# Patient Record
Sex: Male | Born: 1975 | Race: White | Hispanic: No | Marital: Married | Smoking: Former smoker
Health system: Southern US, Community
[De-identification: ages and names within clinical notes are randomized; demographics above are authoritative.]

## PROBLEM LIST (undated history)

## (undated) DIAGNOSIS — I493 Ventricular premature depolarization: Secondary | ICD-10-CM

---

## 2001-01-11 ENCOUNTER — Emergency Department (HOSPITAL_COMMUNITY): Admission: EM | Admit: 2001-01-11 | Discharge: 2001-01-11 | Payer: Self-pay | Admitting: Emergency Medicine

## 2001-02-25 ENCOUNTER — Emergency Department (HOSPITAL_COMMUNITY): Admission: EM | Admit: 2001-02-25 | Discharge: 2001-02-25 | Payer: Self-pay | Admitting: Emergency Medicine

## 2006-06-16 ENCOUNTER — Ambulatory Visit: Payer: Self-pay | Admitting: Family Medicine

## 2006-09-14 ENCOUNTER — Ambulatory Visit: Payer: Self-pay | Admitting: Family Medicine

## 2007-04-24 ENCOUNTER — Encounter (INDEPENDENT_AMBULATORY_CARE_PROVIDER_SITE_OTHER): Payer: Self-pay | Admitting: Internal Medicine

## 2007-08-16 ENCOUNTER — Encounter (INDEPENDENT_AMBULATORY_CARE_PROVIDER_SITE_OTHER): Payer: Self-pay | Admitting: Internal Medicine

## 2007-09-18 ENCOUNTER — Encounter (INDEPENDENT_AMBULATORY_CARE_PROVIDER_SITE_OTHER): Payer: Self-pay | Admitting: *Deleted

## 2007-09-21 ENCOUNTER — Encounter (INDEPENDENT_AMBULATORY_CARE_PROVIDER_SITE_OTHER): Payer: Self-pay | Admitting: Internal Medicine

## 2007-09-21 ENCOUNTER — Ambulatory Visit: Payer: Self-pay | Admitting: Family Medicine

## 2007-09-21 DIAGNOSIS — F17200 Nicotine dependence, unspecified, uncomplicated: Secondary | ICD-10-CM | POA: Insufficient documentation

## 2007-09-22 ENCOUNTER — Encounter (INDEPENDENT_AMBULATORY_CARE_PROVIDER_SITE_OTHER): Payer: Self-pay | Admitting: Internal Medicine

## 2008-06-21 HISTORY — PX: KNEE ARTHROSCOPY W/ MENISCAL REPAIR: SHX1877

## 2008-11-29 ENCOUNTER — Telehealth: Payer: Self-pay | Admitting: Family Medicine

## 2011-10-17 ENCOUNTER — Emergency Department (HOSPITAL_COMMUNITY): Payer: 59

## 2011-10-17 ENCOUNTER — Encounter (HOSPITAL_COMMUNITY): Payer: Self-pay | Admitting: Emergency Medicine

## 2011-10-17 ENCOUNTER — Emergency Department (HOSPITAL_COMMUNITY)
Admission: EM | Admit: 2011-10-17 | Discharge: 2011-10-17 | Disposition: A | Discharge status: Home / Self Care | Payer: 59 | Attending: Emergency Medicine | Admitting: Emergency Medicine

## 2011-10-17 DIAGNOSIS — S02401A Maxillary fracture, unspecified, initial encounter for closed fracture: Secondary | ICD-10-CM | POA: Insufficient documentation

## 2011-10-17 DIAGNOSIS — S7010XA Contusion of unspecified thigh, initial encounter: Secondary | ICD-10-CM | POA: Insufficient documentation

## 2011-10-17 DIAGNOSIS — IMO0002 Reserved for concepts with insufficient information to code with codable children: Secondary | ICD-10-CM | POA: Insufficient documentation

## 2011-10-17 DIAGNOSIS — Z79899 Other long term (current) drug therapy: Secondary | ICD-10-CM | POA: Insufficient documentation

## 2011-10-17 DIAGNOSIS — S0285XA Fracture of orbit, unspecified, initial encounter for closed fracture: Secondary | ICD-10-CM

## 2011-10-17 DIAGNOSIS — S0280XA Fracture of other specified skull and facial bones, unspecified side, initial encounter for closed fracture: Secondary | ICD-10-CM | POA: Insufficient documentation

## 2011-10-17 DIAGNOSIS — R51 Headache: Secondary | ICD-10-CM | POA: Insufficient documentation

## 2011-10-17 DIAGNOSIS — M79609 Pain in unspecified limb: Secondary | ICD-10-CM | POA: Insufficient documentation

## 2011-10-17 DIAGNOSIS — I493 Ventricular premature depolarization: Secondary | ICD-10-CM | POA: Insufficient documentation

## 2011-10-17 DIAGNOSIS — S01409A Unspecified open wound of unspecified cheek and temporomandibular area, initial encounter: Secondary | ICD-10-CM | POA: Insufficient documentation

## 2011-10-17 HISTORY — DX: Ventricular premature depolarization: I49.3

## 2011-10-17 MED ORDER — IBUPROFEN 800 MG PO TABS
800.0000 mg | ORAL_TABLET | Freq: Once | ORAL | Status: AC
  Administered 2011-10-17: 800 mg via ORAL
  Filled 2011-10-17: qty 1

## 2011-10-17 MED ORDER — AMOXICILLIN-POT CLAVULANATE 875-125 MG PO TABS
1.0000 | ORAL_TABLET | ORAL | Status: AC
  Administered 2011-10-17: 1 via ORAL
  Filled 2011-10-17: qty 1

## 2011-10-17 MED ORDER — ACETAMINOPHEN 325 MG PO TABS
975.0000 mg | ORAL_TABLET | Freq: Once | ORAL | Status: AC
  Administered 2011-10-17: 975 mg via ORAL
  Filled 2011-10-17: qty 3

## 2011-10-17 MED ORDER — TETANUS-DIPHTH-ACELL PERTUSSIS 5-2.5-18.5 LF-MCG/0.5 IM SUSP
0.5000 mL | Freq: Once | INTRAMUSCULAR | Status: AC
  Administered 2011-10-17: 0.5 mL via INTRAMUSCULAR
  Filled 2011-10-17: qty 0.5

## 2011-10-17 MED ORDER — AMOXICILLIN-POT CLAVULANATE 875-125 MG PO TABS: 1.0000 | ORAL_TABLET | Freq: Two times a day (BID) | ORAL | Status: AC

## 2011-10-17 MED ORDER — OXYCODONE-ACETAMINOPHEN 5-325 MG PO TABS: 1.0000 | ORAL_TABLET | Freq: Four times a day (QID) | ORAL | Status: AC | PRN
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
## 2011-10-17 NOTE — ED Notes (Signed)
Provided coffee to family member.
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
## 2015-03-03 ENCOUNTER — Ambulatory Visit
Admission: RE | Admit: 2015-03-03 | Discharge: 2015-03-03 | Disposition: A | Discharge status: Home / Self Care | Payer: No Typology Code available for payment source | Source: Ambulatory Visit | Attending: *Deleted | Admitting: *Deleted

## 2015-03-03 ENCOUNTER — Other Ambulatory Visit: Payer: Self-pay | Admitting: *Deleted

## 2015-03-03 DIAGNOSIS — Z9289 Personal history of other medical treatment: Secondary | ICD-10-CM

## 2015-12-12 ENCOUNTER — Ambulatory Visit (INDEPENDENT_AMBULATORY_CARE_PROVIDER_SITE_OTHER): Payer: Commercial Managed Care - HMO

## 2015-12-12 ENCOUNTER — Encounter: Payer: Self-pay | Admitting: Podiatry

## 2015-12-12 ENCOUNTER — Ambulatory Visit (INDEPENDENT_AMBULATORY_CARE_PROVIDER_SITE_OTHER): Payer: Commercial Managed Care - HMO | Admitting: Podiatry

## 2015-12-12 VITALS — BP 140/83 | HR 71 | Resp 16

## 2015-12-12 DIAGNOSIS — M722 Plantar fascial fibromatosis: Secondary | ICD-10-CM

## 2015-12-12 DIAGNOSIS — B351 Tinea unguium: Secondary | ICD-10-CM | POA: Diagnosis not present

## 2015-12-12 MED ORDER — TRIAMCINOLONE ACETONIDE 10 MG/ML IJ SUSP
10.0000 mg | Freq: Once | INTRAMUSCULAR | Status: AC
  Administered 2015-12-12: 10 mg

## 2015-12-12 MED ORDER — DICLOFENAC SODIUM 75 MG PO TBEC: 75.0000 mg | DELAYED_RELEASE_TABLET | Freq: Two times a day (BID) | ORAL | Status: DC

## 2015-12-12 NOTE — Progress Notes (Signed)
   Subjective:    Patient ID: Kevin Chaney, male    DOB: 11-14-1975, 40 y.o.   MRN: [id number]  HPI    Review of Systems  All other systems reviewed and are negative.      Objective:   Physical Exam        Assessment & Plan:

## 2015-12-12 NOTE — Patient Instructions (Signed)

## 2015-12-14 NOTE — Progress Notes (Signed)
Subjective:     Patient ID: Kevin Chaney, male   DOB: January 30, 1976, 40 y.o.   MRN: [id number]  HPI patient presents with quite a bit of discomfort in the heel right over left with inflammation and fluid buildup upon palpation. States that this is been going on for a while and has worsened recently   Review of Systems  All other systems reviewed and are negative.      Objective:   Physical Exam  Constitutional: He is oriented to person, place, and time.  Cardiovascular: Intact distal pulses.   Musculoskeletal: Normal range of motion.  Neurological: He is oriented to person, place, and time.  Skin: Skin is warm.  Nursing note and vitals reviewed.  neurovascular status intact muscle strength adequate range of motion within normal limits with patient noted to have exquisite discomfort in the heel right over left with inflammation and fluid around the medial band. Patient's found to have good digital perfusion and is well oriented 3 with no other pathology noted     Assessment:     Acute plantar fasciitis of the heel right over left    Plan:     H&P and x-rays reviewed with patient. Today I injected the fascial band 3 mg Kenalog 5 mg Xylocaine and applied fascial brace and gave instructions on physical therapy. I went ahead and will see back again in several weeks or earlier if needed  X-ray report indicated small spur with no indications of stress fracture or arthritis at this time

## 2015-12-26 ENCOUNTER — Ambulatory Visit (INDEPENDENT_AMBULATORY_CARE_PROVIDER_SITE_OTHER): Payer: Commercial Managed Care - HMO | Admitting: Podiatry

## 2015-12-26 DIAGNOSIS — M722 Plantar fascial fibromatosis: Secondary | ICD-10-CM

## 2015-12-28 NOTE — Progress Notes (Signed)
Subjective:     Patient ID: Kevin Chaney, male   DOB: 1975-09-05, 40 y.o.   MRN: [id number]  HPI patient presents stating I still get a little pain in my right foot but it has improved quite dramatically   Review of Systems     Objective:   Physical Exam Neurovascular status intact muscle strength adequate range of motion within normal limits with patient noted to have mild to moderate discomfort plantar aspect right heel upon deep palpation    Assessment:     Improving fasciitis right with pain still present    Plan:     Reviewed condition and at this time advised on physical therapy anti-inflammatories supportive shoes and if symptoms persist or get worse we will consider orthotics or other treatment

## 2016-07-08 ENCOUNTER — Ambulatory Visit: Payer: Self-pay | Admitting: Primary Care

## 2016-07-20 ENCOUNTER — Ambulatory Visit: Payer: Self-pay | Admitting: Primary Care

## 2016-07-27 ENCOUNTER — Ambulatory Visit: Payer: Self-pay | Admitting: Primary Care

## 2016-07-27 ENCOUNTER — Ambulatory Visit (INDEPENDENT_AMBULATORY_CARE_PROVIDER_SITE_OTHER): Payer: Commercial Managed Care - HMO | Admitting: Primary Care

## 2016-07-27 ENCOUNTER — Encounter: Payer: Self-pay | Admitting: Primary Care

## 2016-07-27 VITALS — BP 142/80 | HR 82 | Temp 98.0°F | Ht 75.0 in | Wt 252.0 lb

## 2016-07-27 DIAGNOSIS — B001 Herpesviral vesicular dermatitis: Secondary | ICD-10-CM | POA: Diagnosis not present

## 2016-07-27 DIAGNOSIS — F4310 Post-traumatic stress disorder, unspecified: Secondary | ICD-10-CM | POA: Diagnosis not present

## 2016-07-27 DIAGNOSIS — F17200 Nicotine dependence, unspecified, uncomplicated: Secondary | ICD-10-CM

## 2016-07-27 DIAGNOSIS — I493 Ventricular premature depolarization: Secondary | ICD-10-CM

## 2016-07-27 HISTORY — DX: Post-traumatic stress disorder, unspecified: F43.10

## 2016-07-27 MED ORDER — FAMCICLOVIR 500 MG PO TABS: ORAL_TABLET | ORAL | 0 refills | Status: DC

## 2016-07-27 NOTE — Assessment & Plan Note (Signed)
Noted today on exam. Denies chest pain.

## 2016-07-27 NOTE — Assessment & Plan Note (Signed)
No symptoms since treatment for alcoholism. Will trial wean off Zoloft over the next 2+ weeks. He will update if symptoms reoccur.

## 2016-07-27 NOTE — Assessment & Plan Note (Signed)
Long history of, typically occur annually. Discussed to notify if he continues to experience breakouts weekly, will need to consider prophylactic treatment. Rx for famciclovir sent to pharmacy.

## 2016-07-27 NOTE — Patient Instructions (Addendum)
Start weaning off of sertraline (Zoloft). Take 1/2 tablet daily for 7 days, then 1/2 tablet daily for 10 days, then stop.  I sent refills for famciclovir 500 mg tablets to your pharmacy. Take 3 tablets by mouth at once at onset of cold sore. Please notify me if you continue to get these weekly.  I will be in touch regarding Chantix for chewing tobacco. You could try the nicotine gum first and notify me if no improvement.   It was a pleasure to meet you today! Please don't hesitate to call me with any questions. Welcome to [facility]!

## 2016-07-27 NOTE — Assessment & Plan Note (Signed)
Interested in quitting, discussed options today. Will have him trial nicotine gum. Consider Chantix if no improvement.

## 2016-07-27 NOTE — Progress Notes (Signed)
Subjective:    Patient ID: Kevin Chaney, male    DOB: 03/23/1976, 41 y.o.   MRN: [id number]  HPI  Mr. Vite is a 41 year old male who presents today to establish care and discuss the problems mentioned below. Will obtain old records.  1) PTSD: Diagnosed during ETOH rehabilitation 17 months ago. Currently managed on Zoloft 50 mg. The medication makes him drowsy which is why he'd like to wean off. He was previously in counseling for which he completed 1 year ago. He denies symptoms of anxiety, daily worry, irritability. Denies SI/HI.   2) Elevated Blood Pressure: History of readings at 140/80's. His blood pressure has decreased since rehabilitation. Strong family history of high blood pressure in his father. 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 His BP in the office today is 142/80. He does not routinely check his BP.  3) Tobacco Abuse: Has used chewing tobacco for the past 20 years. He dips 2 cans daily and is interested in quitting. He's tried nicotine patches which didn't stay on his skin. He's interested in Chantix for cessation.  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 He will experience sores once annually but over the past 1 month he's been getting cold sores once weekly. He's been taking Famciclovir for his symptoms with improvement.   Review of Systems  Constitutional: Negative for unexpected weight change.  HENT:       No lesions to mouth  Eyes: Negative for visual disturbance.  Respiratory: Negative for shortness of breath.   Cardiovascular: Negative for chest pain.  Skin:       Cold sores  Neurological: Negative for dizziness and headaches.       Cold sores  Psychiatric/Behavioral: Negative for sleep disturbance. The patient is not nervous/anxious.        Past Medical History:  Diagnosis Date  . PVC's (premature ventricular contractions)      Social History   Social History  . Marital status: Married    Spouse name: N/A  .  Number of children: N/A  . Years of education: N/A   Occupational History  . Not on file.   Social History Main Topics  . Smoking status: Former Smoker    Types: Cigarettes  . Smokeless tobacco: Current User     Comment: Dip  . Alcohol use Yes  . Drug use: No  . Sexual activity: Not on file   Other Topics Concern  . Not on file   Social History Narrative   Married.   2 children.   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        Past Surgical History:  Procedure Laterality Date  . KNEE ARTHROSCOPY W/ MENISCAL REPAIR Right 2010    Family History  Problem Relation Age of Onset  . Hypertension Father   . Lung cancer Father   . Alcohol abuse Father   . Alcohol abuse Brother     Allergies  Allergen Reactions  . Codeine     REACTION: GI upset    Current Outpatient Prescriptions on File Prior to Visit  Medication Sig Dispense Refill  . ibuprofen (ADVIL,MOTRIN) 200 MG tablet Take 400 mg by mouth every 6 (six) hours as needed. Reported on 12/12/2015    . sertraline (ZOLOFT) 50 MG tablet Take 50 mg by mouth daily.  3   No current facility-administered medications on file prior to visit.     BP (!) 142/80   Pulse 82  Temp 98 F (36.7 C) (Oral)   Ht 6\' 3"  (1.905 m)   Wt 252 lb (114.3 kg)   SpO2 98%   BMI 31.50 kg/m    Objective:   Physical Exam  Constitutional: He is oriented to person, place, and time. He appears well-nourished.  HENT:  Mouth/Throat: Oropharynx is clear and moist.  Neck: Neck supple.  Cardiovascular: Normal rate and regular rhythm.   Pulmonary/Chest: Effort normal and breath sounds normal. He has no wheezes. He has no rales.  Neurological: He is alert and oriented to person, place, and time.  Skin: Skin is warm and dry.  Psychiatric: He has a normal mood and affect.          Assessment & Plan:

## 2016-07-27 NOTE — Progress Notes (Signed)
Pre visit review using our clinic review tool, if applicable. No additional management support is needed unless otherwise documented below in the visit note. 

## 2016-08-11 ENCOUNTER — Telehealth: Payer: Self-pay | Admitting: Primary Care

## 2016-08-11 NOTE — Telephone Encounter (Signed)
Message left for patient to return my call.  

## 2016-08-11 NOTE — Telephone Encounter (Addendum)
-----   Message from Pleas Koch, NP sent at 07/27/2016  5:37 PM EST ----- Regarding: Chewing Tobacco Please check on patient. How's he doing with the nicotine gum? (Consider Chantix for treatment)

## 2017-05-31 DIAGNOSIS — D225 Melanocytic nevi of trunk: Secondary | ICD-10-CM | POA: Diagnosis not present

## 2017-05-31 DIAGNOSIS — D2261 Melanocytic nevi of right upper limb, including shoulder: Secondary | ICD-10-CM | POA: Diagnosis not present

## 2017-05-31 DIAGNOSIS — D2262 Melanocytic nevi of left upper limb, including shoulder: Secondary | ICD-10-CM | POA: Diagnosis not present

## 2017-06-30 ENCOUNTER — Other Ambulatory Visit: Payer: Self-pay | Admitting: Primary Care

## 2017-06-30 DIAGNOSIS — B001 Herpesviral vesicular dermatitis: Secondary | ICD-10-CM

## 2017-07-01 NOTE — Telephone Encounter (Signed)
Ok to refill? Electronically refill request for famciclovir (FAMVIR) 500 MG tablet  Last prescribed on 07/27/2016. Last seen on 07/27/2016

## 2017-07-01 NOTE — Telephone Encounter (Signed)
Needs office visit for further refills. Please schedule.  

## 2017-07-04 NOTE — Telephone Encounter (Signed)
Left detailed message on voicemail for patient to call and schedule appointment for further refills per [facility]. (on DPR)

## 2017-07-06 NOTE — Telephone Encounter (Signed)
Letter sending to remind patient to schedule office visit.  Per DPR, left detail message of Kate's comments for patient to schedule appt.

## 2018-05-31 DIAGNOSIS — L570 Actinic keratosis: Secondary | ICD-10-CM | POA: Diagnosis not present

## 2018-05-31 DIAGNOSIS — D225 Melanocytic nevi of trunk: Secondary | ICD-10-CM | POA: Diagnosis not present

## 2018-05-31 DIAGNOSIS — B351 Tinea unguium: Secondary | ICD-10-CM | POA: Diagnosis not present

## 2019-03-06 ENCOUNTER — Other Ambulatory Visit: Payer: Self-pay

## 2019-03-06 ENCOUNTER — Ambulatory Visit
Admission: RE | Admit: 2019-03-06 | Discharge: 2019-03-06 | Disposition: A | Discharge status: Home / Self Care | Payer: No Typology Code available for payment source | Source: Ambulatory Visit | Attending: Nurse Practitioner | Admitting: Nurse Practitioner

## 2019-03-06 ENCOUNTER — Other Ambulatory Visit: Payer: Self-pay | Admitting: Nurse Practitioner

## 2019-03-06 DIAGNOSIS — T148XXA Other injury of unspecified body region, initial encounter: Secondary | ICD-10-CM

## 2021-05-01 ENCOUNTER — Ambulatory Visit (INDEPENDENT_AMBULATORY_CARE_PROVIDER_SITE_OTHER): Payer: 59

## 2021-05-01 ENCOUNTER — Other Ambulatory Visit: Payer: Self-pay

## 2021-05-01 ENCOUNTER — Ambulatory Visit: Payer: 59 | Admitting: Podiatry

## 2021-05-01 ENCOUNTER — Encounter: Payer: Self-pay | Admitting: Podiatry

## 2021-05-01 DIAGNOSIS — M722 Plantar fascial fibromatosis: Secondary | ICD-10-CM

## 2021-05-01 MED ORDER — TRIAMCINOLONE ACETONIDE 10 MG/ML IJ SUSP
10.0000 mg | Freq: Once | INTRAMUSCULAR | Status: AC
  Administered 2021-05-01: 10 mg

## 2021-05-01 MED ORDER — DICLOFENAC SODIUM 75 MG PO TBEC: 75.0000 mg | DELAYED_RELEASE_TABLET | Freq: Two times a day (BID) | ORAL | 2 refills | Status: DC

## 2021-05-01 NOTE — Patient Instructions (Signed)

## 2021-05-04 NOTE — Progress Notes (Signed)
Subjective:   Patient ID: Kevin Chaney, male   DOB: 45 y.o.   MRN: [id number]   HPI Patient's not been seen for a lot of years and over the last 6 months has developed progressive pain in the left heel at the insertional point of the tendon into the calcaneus.  Does not remember specific injury does not smoke likes to be active   Review of Systems  All other systems reviewed and are negative.      Objective:  Physical Exam Vitals and nursing note reviewed.  Constitutional:      Appearance: He is well-developed.  Pulmonary:     Effort: Pulmonary effort is normal.  Musculoskeletal:        General: Normal range of motion.  Skin:    General: Skin is warm.  Neurological:     Mental Status: He is alert.    Neurovascular status intact muscle strength found to be adequate range of motion adequate mild equinus condition noted with discomfort plantar heel left at the insertional point of the tendon into the calcaneus with fluid buildup around the insertional point     Assessment:  Acute plantar fasciitis left inflammation fluid around the band     Plan:  H&P reviewed condition sterile prep done and injected the plantar fascia at insertion 3 mg Kenalog 5 mg Xylocaine applied fascial brace to lift up the arch instructed on supportive shoes anti-inflammatories diclofenac 75 mg twice daily and physical therapy  X-rays indicate spur formation no indication stress fracture arthritis

## 2021-08-10 ENCOUNTER — Ambulatory Visit: Payer: 59 | Admitting: Podiatry

## 2021-08-10 ENCOUNTER — Other Ambulatory Visit: Payer: Self-pay

## 2021-08-10 DIAGNOSIS — M722 Plantar fascial fibromatosis: Secondary | ICD-10-CM | POA: Diagnosis not present

## 2021-08-10 MED ORDER — TRIAMCINOLONE ACETONIDE 10 MG/ML IJ SUSP
10.0000 mg | Freq: Once | INTRAMUSCULAR | Status: AC
  Administered 2021-08-10: 10 mg

## 2021-08-10 MED ORDER — MELOXICAM 15 MG PO TABS: 15.0000 mg | ORAL_TABLET | Freq: Every day | ORAL | 2 refills | Status: DC

## 2021-08-10 NOTE — Progress Notes (Signed)
Subjective:   Patient ID: Kevin Chaney, male   DOB: 46 y.o.   MRN: [id number]   HPI Patient states his heel has been quite sore and making it hard for him to walk.  States that he has tried different modalities without relief of symptoms and has had gradual increase in symptoms over the last few weeks   ROS      Objective:  Physical Exam  Neurovascular status intact exquisite discomfort left plantar fascia at insertion which is gotten worse recently with patient having job that he is on his feet for an long periods of time     Assessment:  Acute plantar fasciitis left worse when he gets up the morning after periods of sitting     Plan:  H&P reviewed condition sterile prep injected the fascia 3 mg Kenalog 5 mg Xylocaine dispensed night splint with instructions on usage along with ice therapy.  Reappoint to recheck and may require orthotics if symptoms persist

## 2021-08-31 ENCOUNTER — Other Ambulatory Visit: Payer: Self-pay

## 2021-08-31 ENCOUNTER — Encounter: Payer: Self-pay | Admitting: Podiatry

## 2021-08-31 ENCOUNTER — Ambulatory Visit: Payer: 59

## 2021-08-31 ENCOUNTER — Ambulatory Visit: Payer: 59 | Admitting: Podiatry

## 2021-08-31 DIAGNOSIS — M722 Plantar fascial fibromatosis: Secondary | ICD-10-CM

## 2021-08-31 MED ORDER — TRIAMCINOLONE ACETONIDE 10 MG/ML IJ SUSP
10.0000 mg | Freq: Once | INTRAMUSCULAR | Status: AC
  Administered 2021-08-31: 10 mg

## 2021-08-31 MED ORDER — PREDNISONE 10 MG PO TABS: ORAL_TABLET | ORAL | 0 refills | Status: DC

## 2021-08-31 NOTE — Progress Notes (Signed)
Subjective:  ? ?Patient ID: Kevin Chaney, male   DOB: 46 y.o.   MRN: [id number]  ? ?HPI ?Patient presents stating still having a lot of pain in his left heel especially when walking and he knows he needs orthotics and has had numerous bouts of heel pain in his history ? ? ?ROS ? ? ?   ?Objective:  ?Physical Exam  ?Neurovascular status intact with exquisite discomfort plantar fascial left still present medial and into the central band at the insertion calcaneus with flatfoot deformity ? ?   ?Assessment:  ?Acute Planter fasciitis left that is not responding so far to conservative aggressive treatment ? ?   ?Plan:  ?H&P educated him on this and at this point I am applying air fracture walker to completely immobilize the heel and take all stress off at hoping that we can get him out of this acute crisis.  I did go ahead today also casted him for functional orthotics to lift up his arch and I did do sterile prep and I reinjected the fascia 3 mg Kenalog 5 mg Xylocaine and also put him on a steroid 12-day Dosepak.  Patient will be seen back in approximately 4 to 5 weeks orthotics and will see me depending on response with education for wearing the boot full-time for 3 to 4 weeks and then gradually reduce ?   ? ? ?

## 2021-08-31 NOTE — Progress Notes (Signed)
SITUATION ?Reason for Consult: Evaluation for Bilateral Custom Foot Orthoses ?Patient / Caregiver Report: Patient is ready for foot orthotics ? ?OBJECTIVE DATA: ?Patient History / Diagnosis:  ?  ICD-10-CM   ?1. Plantar fasciitis of left foot  M72.2   ?  ? ? ?Current or Previous Devices: None and no history ? ?Foot Examination: ?Skin presentation:   Intact ?Ulcers & Callousing:   None and no history ?Toe / Foot Deformities:  Pes cavus ?Weight Bearing Presentation:  Cavus ?Sensation:    Intact ? ?Shoe Size: 34M ? ?ORTHOTIC RECOMMENDATION ?Recommended Device: 1x pair of custom functional foot orthotics ? ?GOALS OF ORTHOSES ?- Reduce Pain ?- Prevent Foot Deformity ?- Prevent Progression of Further Foot Deformity ?- Relieve Pressure ?- Improve the Overall Biomechanical Function of the Foot and Lower Extremity. ? ?ACTIONS PERFORMED ?Patient was casted for Foot Orthoses via crush box. Procedure was explained and patient tolerated procedure well. All questions were answered and concerns addressed. ? ?PLAN ?Potential out of pocket cost was communicated to patient. Casts are to be sent to [facility] for fabrication. Patient is to be called for fitting when devices are ready.  ? ? ?

## 2021-09-21 ENCOUNTER — Telehealth: Payer: Self-pay | Admitting: Podiatry

## 2021-09-21 NOTE — Telephone Encounter (Signed)
Left message  for opu 

## 2021-09-28 ENCOUNTER — Ambulatory Visit: Payer: 59

## 2021-09-28 DIAGNOSIS — M722 Plantar fascial fibromatosis: Secondary | ICD-10-CM

## 2021-09-28 NOTE — Progress Notes (Signed)

## 2021-12-25 ENCOUNTER — Telehealth: Payer: Self-pay

## 2021-12-25 NOTE — Telephone Encounter (Signed)
LVM for patient to call and schedule

## 2021-12-25 NOTE — Telephone Encounter (Signed)
Patient was called, as he hasnt been seen in 5 years, asking if okay to remain a patient of [facility].

## 2021-12-25 NOTE — Telephone Encounter (Signed)
Yes of course

## 2022-12-08 ENCOUNTER — Encounter: Payer: Self-pay | Admitting: Primary Care

## 2022-12-08 ENCOUNTER — Ambulatory Visit: Payer: 59 | Admitting: Primary Care

## 2022-12-08 VITALS — BP 136/74 | HR 75 | Temp 97.3°F | Ht 75.0 in | Wt 253.0 lb

## 2022-12-08 DIAGNOSIS — Z23 Encounter for immunization: Secondary | ICD-10-CM

## 2022-12-08 DIAGNOSIS — Z1211 Encounter for screening for malignant neoplasm of colon: Secondary | ICD-10-CM

## 2022-12-08 DIAGNOSIS — B001 Herpesviral vesicular dermatitis: Secondary | ICD-10-CM

## 2022-12-08 DIAGNOSIS — R1084 Generalized abdominal pain: Secondary | ICD-10-CM

## 2022-12-08 DIAGNOSIS — F4310 Post-traumatic stress disorder, unspecified: Secondary | ICD-10-CM

## 2022-12-08 MED ORDER — FAMCICLOVIR 500 MG PO TABS: ORAL_TABLET | ORAL | 0 refills | Status: DC

## 2022-12-08 NOTE — Assessment & Plan Note (Signed)
Resolved.  Remain off of Zoloft.

## 2022-12-08 NOTE — Assessment & Plan Note (Signed)
With beef or pork products.  Given significant history of recent tick bites, will screen for alpha gal. No symptoms or evidence of Lyme disease or [place] spotted fever.  Avoid beef and pork products at this time.

## 2022-12-08 NOTE — Assessment & Plan Note (Signed)
Infrequent outbreaks.  Continue famciclovir 300 mg x 1 dose as needed. Refills provided today.

## 2022-12-08 NOTE — Patient Instructions (Signed)
Stop by the lab prior to leaving today. I will notify you of your results once received.   Avoid beef/pork/lamb products for now.  You will be contacted via phone regarding your referral to GI. Please let us know if you have not been contacted by anyone within two weeks.  It was a pleasure to see you today! Welcome back!

## 2022-12-08 NOTE — Progress Notes (Signed)
Subjective:    Patient ID: Kevin Chaney, male    DOB: 12/15/1975, 47 y.o.   MRN: [id number]  HPI  Kevin Chaney is a very pleasant 47 y.o. male who presents today to reestablish care and to discuss the concerns mentioned below.  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  He will bring a copy of his labs. He has not completed a colonoscopy.  His tetanus vaccine is due.  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 Overall outbreaks are infrequent, and his last outbreak was last Winter. He is needing refills today.  2) PTSD: Previously managed on Zoloft 50 mg. He has not taken this in several years. Overall he feels well managed. He completed counseling around 2017 and has done well since.   3) Tick Bite: In April 2024 he found 6 ticks attached under his abdomen after wild [place] hunting. A few weeks later he found another tick behind his right ear. None of the ticks were engorged and he was able to remove them all.   Since his tick bites he's noticed symptoms of intermittent GI upset with eating beef and pork with occasional headache, nausea, loose stools. He denies symptoms with most dairy products, itching, rashes.  His symptoms only occur after eating pork or beef.  He can eat fish or chicken without symptoms.  He has not had alpha gal testing.  Review of Systems  Constitutional:  Negative for fatigue.  HENT:  Negative for rhinorrhea.   Respiratory:  Negative for shortness of breath.   Cardiovascular:  Negative for chest pain.  Gastrointestinal:  Negative for constipation.       Intermittent GI upset with pork and beef.  Psychiatric/Behavioral:  The patient is not nervous/anxious.          Past Medical History:  Diagnosis Date   PTSD (post-traumatic stress disorder) 07/27/2016   PVC's (premature ventricular contractions)     Social History   Socioeconomic History    Marital status: Married    Spouse name: Not on file   Number of children: Not on file   Years of education: Not on file   Highest education level: Not on file  Occupational History   Not on file  Tobacco Use   Smoking status: Former    Types: Cigarettes   Smokeless tobacco: Current   Tobacco comments:    Dip  Substance and Sexual Activity   Alcohol use: Yes   Drug use: No   Sexual activity: Not on file  Other Topics Concern   Not on file  Social History Narrative   Married.   2 children.   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    Social Determinants of Health   Financial Resource Strain: Not on file  Food Insecurity: Not on file  Transportation Needs: Not on file  Physical Activity: Not on file  Stress: Not on file  Social Connections: Not on file  Intimate Partner Violence: Not on file    Past Surgical History:  Procedure Laterality Date   KNEE ARTHROSCOPY W/ MENISCAL REPAIR Right 2010    Family History  Problem Relation Age of Onset   Hypertension Father    Lung cancer Father    Alcohol abuse Father    Alcohol abuse Brother     Allergies  Allergen Reactions   Codeine     REACTION: GI upset  No current outpatient medications on file prior to visit.   No current facility-administered medications on file prior to visit.    BP 136/74   Pulse 75   Temp (!) 97.3 F (36.3 C) (Temporal)   Ht 6\' 3"  (1.905 m)   Wt 253 lb (114.8 kg)   SpO2 98%   BMI 31.62 kg/m  Objective:   Physical Exam Cardiovascular:     Rate and Rhythm: Normal rate and regular rhythm.  Pulmonary:     Effort: Pulmonary effort is normal.     Breath sounds: Normal breath sounds. No wheezing or rales.  Abdominal:     General: Bowel sounds are normal.     Palpations: Abdomen is soft.     Tenderness: There is no abdominal tenderness.  Musculoskeletal:     Cervical back: Neck supple.  Skin:    General: Skin is warm and dry.  Neurological:     Mental Status: He is alert  and oriented to person, place, and time.  Psychiatric:        Mood and Affect: Mood normal.           Assessment & Plan:  Generalized abdominal discomfort Assessment & Plan: With beef or pork products.  Given significant history of recent tick bites, will screen for alpha gal. No symptoms or evidence of Lyme disease or [place] spotted fever.  Avoid beef and pork products at this time.  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  Continue famciclovir 300 mg x 1 dose as needed. Refills provided today.  Orders: -     Famciclovir; Take 3 tablets by mouth x 1 dose as needed for outbreaks.  Dispense: 9 tablet; Refill: 0  PTSD (post-traumatic stress disorder) Assessment & Plan: Resolved.  Remain off of Zoloft.   Screening for colon cancer -     Ambulatory referral to Gastroenterology        Doreene Nest, NP

## 2022-12-09 LAB — ALPHA-GAL PANEL
Allergen, Mutton, f88: <0.1 kU/L
CLASS: 0
Class: 0

## 2022-12-10 ENCOUNTER — Encounter: Payer: Self-pay | Admitting: Gastroenterology

## 2022-12-14 LAB — ALPHA-GAL PANEL
Allergen, Pork, f26: <0.1 kU/L
Beef: <0.1 kU/L
CLASS: 0
GALACTOSE-ALPHA-1,3-GALACTOSE IGE*: <0.1 kU/L (ref <0.10)

## 2022-12-14 LAB — INTERPRETATION:

## 2023-01-03 ENCOUNTER — Ambulatory Visit (AMBULATORY_SURGERY_CENTER): Payer: 59

## 2023-01-03 VITALS — Ht 75.0 in | Wt 250.0 lb

## 2023-01-03 DIAGNOSIS — Z1211 Encounter for screening for malignant neoplasm of colon: Secondary | ICD-10-CM

## 2023-01-03 MED ORDER — NA SULFATE-K SULFATE-MG SULF 17.5-3.13-1.6 GM/177ML PO SOLN: 1.0000 | Freq: Once | ORAL | 0 refills | Status: AC

## 2023-01-03 NOTE — Progress Notes (Signed)
No egg or soy allergy known to patient  No issues known to pt with past sedation with any surgeries or procedures Patient denies ever being told they had issues or difficulty with intubation  No FH of Malignant Hyperthermia Pt is not on diet pills Pt is not on  home 02  Pt is not on blood thinners  Pt denies issues with constipation  No A fib or A flutter Have any cardiac testing pending--no  LOA: independent  Prep: suprep   Patient's chart reviewed by Kevin Chaney CNRA prior to previsit and patient appropriate for the [facility].  Previsit completed and red dot placed by patient's name on their procedure day (on provider's schedule).     PV competed with patient. Prep instructions sent via mychart and home address. Goodrx coupon for [facility] provided to use for price reduction if needed.   

## 2023-01-04 ENCOUNTER — Encounter: Payer: Self-pay | Admitting: Gastroenterology

## 2023-01-15 ENCOUNTER — Other Ambulatory Visit: Payer: Self-pay | Admitting: Primary Care

## 2023-01-15 DIAGNOSIS — B001 Herpesviral vesicular dermatitis: Secondary | ICD-10-CM

## 2023-01-19 ENCOUNTER — Ambulatory Visit (AMBULATORY_SURGERY_CENTER): Payer: 59 | Admitting: Gastroenterology

## 2023-01-19 ENCOUNTER — Encounter: Payer: Self-pay | Admitting: Gastroenterology

## 2023-01-19 VITALS — BP 114/82 | HR 66 | Temp 98.7°F | Resp 15 | Ht 75.0 in | Wt 250.0 lb

## 2023-01-19 DIAGNOSIS — Z1211 Encounter for screening for malignant neoplasm of colon: Secondary | ICD-10-CM

## 2023-01-19 MED ORDER — SODIUM CHLORIDE 0.9 % IV SOLN: 500.0000 mL | Freq: Once | INTRAVENOUS | Status: DC

## 2023-01-19 NOTE — Progress Notes (Signed)
History & Physical  Primary Care Physician:  Doreene Nest, NP Primary Gastroenterologist: Claudette Head, MD  Impression / Plan:  Average risk CRC screening for colonoscopy.  CHIEF COMPLAINT:  CRC screening  HPI: Kevin Chaney is a 47 y.o. male average risk CRC screening for colonoscopy.    Past Medical History:  Diagnosis Date   PTSD (post-traumatic stress disorder) 07/27/2016   PVC's (premature ventricular contractions)     Past Surgical History:  Procedure Laterality Date   KNEE ARTHROSCOPY W/ MENISCAL REPAIR Right 2010    Prior to Admission medications   Medication Sig Start Date End Date Taking? Authorizing Provider  famciclovir (FAMVIR) 500 MG tablet Take 3 tablets by mouth x 1 dose as needed for outbreaks. 12/08/22   Doreene Nest, NP    Current Outpatient Medications  Medication Sig Dispense Refill   famciclovir (FAMVIR) 500 MG tablet Take 3 tablets by mouth x 1 dose as needed for outbreaks. 9 tablet 0   Current Facility-Administered Medications  Medication Dose Route Frequency Provider Last Rate Last Admin   0.9 %  sodium chloride infusion  500 mL Intravenous Once Meryl Dare, MD        Allergies as of 01/19/2023 - Review Complete 01/19/2023  Allergen Reaction Noted   Codeine      Family History  Problem Relation Age of Onset   Hypertension Father    Lung cancer Father    Alcohol abuse Father    Alcohol abuse Brother    Colon cancer Neg Hx    Colon polyps Neg Hx    Esophageal cancer Neg Hx    Rectal cancer Neg Hx    Stomach cancer Neg Hx     Social History   Socioeconomic History   Marital status: Married    Spouse name: Not on file   Number of children: Not on file   Years of education: Not on file   Highest education level: Not on file  Occupational History   Not on file  Tobacco Use   Smoking status: Former    Types: Cigarettes   Smokeless tobacco: Current   Tobacco comments:    Dip  Vaping Use   Vaping status:  Never Used  Substance and Sexual Activity   Alcohol use: Not Currently   Drug use: No   Sexual activity: Not on file  Other Topics Concern   Not on file  Social History Narrative   Married.   2 children.   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    Social Determinants of Health   Financial Resource Strain: Not on file  Food Insecurity: Not on file  Transportation Needs: Not on file  Physical Activity: Not on file  Stress: Not on file  Social Connections: Not on file  Intimate Partner Violence: Not on file    Review of Systems:  All systems reviewed were negative except where noted in HPI.   Physical Exam:  General:  Alert, well-developed, in NAD Head:  Normocephalic and atraumatic. Eyes:  Sclera clear, no icterus.   Conjunctiva pink. Ears:  Normal auditory acuity. Mouth:  No deformity or lesions.  Neck:  Supple; no masses. Lungs:  Clear throughout to auscultation.   No wheezes, crackles, or rhonchi.  Heart:  Regular rate and rhythm; no murmurs. Abdomen:  Soft, nondistended, nontender. No masses, hepatomegaly. No palpable masses.  Normal bowel sounds.    Rectal:  Deferred   Msk:  Symmetrical without gross deformities. Extremities:  Without  edema. Neurologic:  Alert and  oriented x 4; grossly normal neurologically. Skin:  Intact without significant lesions or rashes. Psych:  Alert and cooperative. Normal mood and affect.   Venita Lick. Russella Dar  01/19/2023, 1:53 PM See Loretha Stapler, [facility], to contact our on call provider

## 2023-01-19 NOTE — Op Note (Signed)
[facility] Patient Name: Cayde Masley Procedure Date: 01/19/2023 1:53 PM MRN: [id number] Endoscopist: Meryl Dare , MD, [phone] Age: 47 Referring MD:  Date of Birth: [birth year] Gender: Male Account #: [id number] Procedure:                Colonoscopy Indications:              Screening for colorectal malignant neoplasm Medicines:                Monitored Anesthesia Care Procedure:                Pre-Anesthesia Assessment:                           - Prior to the procedure, a History and Physical                            was performed, and patient medications and                            allergies were reviewed. The patient's tolerance of                            previous anesthesia was also reviewed. The risks                            and benefits of the procedure and the sedation                            options and risks were discussed with the patient.                            All questions were answered, and informed consent                            was obtained. Prior Anticoagulants: The patient has                            taken no anticoagulant or antiplatelet agents. ASA                            Grade Assessment: II - A patient with mild systemic                            disease. After reviewing the risks and benefits,                            the patient was deemed in satisfactory condition to                            undergo the procedure.                           After obtaining informed consent, the colonoscope  was passed under direct vision. Throughout the                            procedure, the patient's blood pressure, pulse, and                            oxygen saturations were monitored continuously. The                            Olympus Scope [id number] was introduced through the                            anus and advanced to the the terminal ileum, with                            identification  of the appendiceal orifice and IC                            valve. The ileocecal valve, appendiceal orifice,                            and rectum were photographed. The quality of the                            bowel preparation was good. The colonoscopy was                            performed without difficulty. The patient tolerated                            the procedure well. Scope In: 1:59:25 PM Scope Out: 2:15:42 PM Scope Withdrawal Time: 0 hours 12 minutes 37 seconds  Total Procedure Duration: 0 hours 16 minutes 17 seconds  Findings:                 The perianal and digital rectal examinations were                            normal.                           The terminal ileum appeared normal.                           Multiple small-mouthed diverticula were found in                            the left colon.                           External hemorrhoids were found during                            retroflexion. The hemorrhoids were small.  The exam was otherwise without abnormality on                            direct and retroflexion views. Complications:            No immediate complications. Estimated blood loss:                            None. Estimated Blood Loss:     Estimated blood loss: none. Impression:               - The examined portion of the ileum was normal.                           - Mild diverticulosis in the left colon.                           - External hemorrhoids.                           - The examination was otherwise normal on direct                            and retroflexion views.                           - No specimens collected. Recommendation:           - Repeat colonoscopy in 10 years for screening                            purposes.                           - Patient has a contact number available for                            emergencies. The signs and symptoms of potential                             delayed complications were discussed with the                            patient. Return to normal activities tomorrow.                            Written discharge instructions were provided to the                            patient.                           - High fiber diet.                           - Continue present medications. Meryl Dare, MD 01/19/2023 2:18:14 PM This report has been signed electronically.

## 2023-01-19 NOTE — Patient Instructions (Signed)
Discharge instructions given. Handouts on Diverticulosis and Hemorrhoids. Resume previous medications. YOU HAD AN ENDOSCOPIC PROCEDURE TODAY AT THE [facility]:   Refer to the procedure report that was given to you for any specific questions about what was found during the examination.  If the procedure report does not answer your questions, please call your gastroenterologist to clarify.  If you requested that your care partner not be given the details of your procedure findings, then the procedure report has been included in a sealed envelope for you to review at your convenience later.  YOU SHOULD EXPECT: Some feelings of bloating in the abdomen. Passage of more gas than usual.  Walking can help get rid of the air that was put into your GI tract during the procedure and reduce the bloating. If you had a lower endoscopy (such as a colonoscopy or flexible sigmoidoscopy) you may notice spotting of blood in your stool or on the toilet paper. If you underwent a bowel prep for your procedure, you may not have a normal bowel movement for a few days.  Please Note:  You might notice some irritation and congestion in your nose or some drainage.  This is from the oxygen used during your procedure.  There is no need for concern and it should clear up in a day or so.  SYMPTOMS TO REPORT IMMEDIATELY:  Following lower endoscopy (colonoscopy or flexible sigmoidoscopy):  Excessive amounts of blood in the stool  Significant tenderness or worsening of abdominal pains  Swelling of the abdomen that is new, acute  Fever of 100F or higher  For urgent or emergent issues, a gastroenterologist can be reached at any hour by calling [phone]. Do not use MyChart messaging for urgent concerns.    DIET:  We do recommend a small meal at first, but then you may proceed to your regular diet.  Drink plenty of fluids but you should avoid alcoholic beverages for 24 hours.  ACTIVITY:  You should plan to take  it easy for the rest of today and you should NOT DRIVE or use heavy machinery until tomorrow (because of the sedation medicines used during the test).    FOLLOW UP: Our staff will call the number listed on your records the next business day following your procedure.  We will call around 7:15- 8:00 am to check on you and address any questions or concerns that you may have regarding the information given to you following your procedure. If we do not reach you, we will leave a message.     If any biopsies were taken you will be contacted by phone or by letter within the next 1-3 weeks.  Please call us at [phone] if you have not heard about the biopsies in 3 weeks.    SIGNATURES/CONFIDENTIALITY: You and/or your care partner have signed paperwork which will be entered into your electronic medical record.  These signatures attest to the fact that that the information above on your After Visit Summary has been reviewed and is understood.  Full responsibility of the confidentiality of this discharge information lies with you and/or your care-partner.  

## 2023-01-19 NOTE — Progress Notes (Signed)
Uneventful anesthetic. Report to pacu rn. Vss. Care resumed by rn. 

## 2023-01-21 ENCOUNTER — Telehealth: Payer: Self-pay

## 2023-01-21 NOTE — Telephone Encounter (Signed)
Follow up call placed, VM obtained and message left. 

## 2023-04-15 ENCOUNTER — Encounter: Payer: Self-pay | Admitting: Primary Care

## 2023-04-15 ENCOUNTER — Ambulatory Visit: Payer: 59 | Admitting: Primary Care

## 2023-04-15 VITALS — BP 138/72 | HR 92 | Temp 97.7°F | Ht 75.0 in | Wt 247.2 lb

## 2023-04-15 DIAGNOSIS — N529 Male erectile dysfunction, unspecified: Secondary | ICD-10-CM | POA: Insufficient documentation

## 2023-04-15 LAB — COMPREHENSIVE METABOLIC PANEL
ALT: 19 U/L (ref 0–53)
AST: 22 U/L (ref 0–37)
Albumin: 4.1 g/dL (ref 3.5–5.2)
Alkaline Phosphatase: 59 U/L (ref 39–117)
BUN: 14 mg/dL (ref 6–23)
CO2: 29 meq/L (ref 19–32)
Calcium: 9.4 mg/dL (ref 8.4–10.5)
Chloride: 105 meq/L (ref 96–112)
Creatinine, Ser: 0.9 mg/dL (ref 0.40–1.50)
GFR: 100.63 mL/min (ref >60.00)
Glucose, Bld: 68 mg/dL — ABNORMAL LOW (ref 70–99)
Potassium: 4.2 meq/L (ref 3.5–5.1)
Sodium: 142 meq/L (ref 135–145)
Total Bilirubin: 0.7 mg/dL (ref 0.2–1.2)
Total Protein: 6.7 g/dL (ref 6.0–8.3)

## 2023-04-15 LAB — TESTOSTERONE: Testosterone: 328.73 ng/dL (ref 300.00–890.00)

## 2023-04-15 MED ORDER — SILDENAFIL CITRATE 20 MG PO TABS: ORAL_TABLET | ORAL | 0 refills | Status: DC

## 2023-04-15 NOTE — Assessment & Plan Note (Signed)
Commended him on regular exercise.  Encouraged to continue.  He will drop a copy of his labs very soon. Checking testosterone levels and CMP today.  Start sildenafil 20 mg tablets.  Take 2 to 5 mg tablets 30 to 60 minutes prior to sexual activity. Good Rx card provided.

## 2023-04-15 NOTE — Progress Notes (Signed)
Subjective:    Patient ID: Kevin Chaney, male    DOB: 1975-09-26, 47 y.o.   MRN: [id number]  Erectile Dysfunction    Kevin Chaney is a very pleasant 47 y.o. male with a history of herpes labialis who presents today to discuss erectile dysfunction.   Chronic for the last 6-8 months. Difficulty maintaining an erection and doesn't feel that he gets a complete erection.  This occurs each time he engages in sexual activity.  He does not take supplements OTC.   He recently began exercising. His last PSA, along with many other labs, was checked by his employer last month and was normal.    He denies dizziness, chest pain, penile pain, difficulty urinating, erectile pain, hematuria.  Wt Readings from Last 3 Encounters:  04/15/23 247 lb 3.2 oz (112.1 kg)  01/19/23 250 lb (113.4 kg)  01/03/23 250 lb (113.4 kg)   BP Readings from Last 3 Encounters:  04/15/23 138/72  01/19/23 114/82  12/08/22 136/74      Review of Systems  Respiratory:  Negative for shortness of breath.   Cardiovascular:  Negative for chest pain.  Genitourinary:  Negative for difficulty urinating, penile pain and testicular pain.       Erectile dysfunction   Neurological:  Negative for dizziness.         Past Medical History:  Diagnosis Date   PTSD (post-traumatic stress disorder) 07/27/2016   PVC's (premature ventricular contractions)     Social History   Socioeconomic History   Marital status: Married    Spouse name: Not on file   Number of children: Not on file   Years of education: Not on file   Highest education level: Not on file  Occupational History   Not on file  Tobacco Use   Smoking status: Former    Types: Cigarettes   Smokeless tobacco: Current   Tobacco comments:    Dip  Vaping Use   Vaping status: Never Used  Substance and Sexual Activity   Alcohol use: Not Currently   Drug use: No   Sexual activity: Not on file  Other Topics Concern   Not on file  Social History Narrative    Married.   2 children.   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    Social Determinants of Health   Financial Resource Strain: Not on file  Food Insecurity: Not on file  Transportation Needs: Not on file  Physical Activity: Not on file  Stress: Not on file  Social Connections: Not on file  Intimate Partner Violence: Not on file    Past Surgical History:  Procedure Laterality Date   KNEE ARTHROSCOPY W/ MENISCAL REPAIR Right 2010    Family History  Problem Relation Age of Onset   Hypertension Father    Lung cancer Father    Alcohol abuse Father    Alcohol abuse Brother    Colon cancer Neg Hx    Colon polyps Neg Hx    Esophageal cancer Neg Hx    Rectal cancer Neg Hx    Stomach cancer Neg Hx     Allergies  Allergen Reactions   Codeine     REACTION: GI upset    Current Outpatient Medications on File Prior to Visit  Medication Sig Dispense Refill   famciclovir (FAMVIR) 500 MG tablet Take 3 tablets by mouth x 1 dose as needed for outbreaks. 9 tablet 0   No current facility-administered medications on file prior to visit.  BP 138/72 (BP Location: Left Arm, Patient Position: Sitting, Cuff Size: Normal)   Pulse 92   Temp 97.7 F (36.5 C) (Temporal)   Ht 6\' 3"  (1.905 m)   Wt 247 lb 3.2 oz (112.1 kg)   SpO2 98%   BMI 30.90 kg/m  Objective:   Physical Exam Cardiovascular:     Rate and Rhythm: Normal rate and regular rhythm.  Pulmonary:     Effort: Pulmonary effort is normal.     Breath sounds: Normal breath sounds.  Musculoskeletal:     Cervical back: Neck supple.  Skin:    General: Skin is warm and dry.  Neurological:     Mental Status: He is alert and oriented to person, place, and time.  Psychiatric:        Mood and Affect: Mood normal.           Assessment & Plan:  Erectile dysfunction, unspecified erectile dysfunction type Assessment & Plan: Commended him on regular exercise.  Encouraged to continue.  He will drop a copy of his labs  very soon. Checking testosterone levels and CMP today.  Start sildenafil 20 mg tablets.  Take 2 to 5 mg tablets 30 to 60 minutes prior to sexual activity. Good Rx card provided.    Orders: -     Testosterone -     Comprehensive metabolic panel -     Sildenafil Citrate; Take 2 to 5 tablets by mouth 30 to 60 minutes prior to sexual activity.  Dispense: 50 tablet; Refill: 0        Doreene Nest, NP

## 2023-04-15 NOTE — Patient Instructions (Signed)
Stop by the lab prior to leaving today. I will notify you of your results once received.   Start sildenafil (Viagra) 20 mg tablets as needed.  Take 2 to 5 tablets by mouth 30 to 60 minutes prior to sexual activity.  Please bring me a copy of your labs!  It was a pleasure to see you today!

## 2023-04-25 ENCOUNTER — Ambulatory Visit: Payer: 59 | Admitting: Nurse Practitioner

## 2023-04-25 VITALS — BP 120/60 | HR 81 | Temp 98.1°F | Ht 75.0 in | Wt 241.2 lb

## 2023-04-25 DIAGNOSIS — L03012 Cellulitis of left finger: Secondary | ICD-10-CM

## 2023-04-25 MED ORDER — DOXYCYCLINE HYCLATE 100 MG PO TABS
100.0000 mg | ORAL_TABLET | Freq: Two times a day (BID) | ORAL | 0 refills | Status: AC
Start: 2023-04-25 — End: 2023-05-02

## 2023-04-25 NOTE — Assessment & Plan Note (Signed)
Will start patient with treatment with doxycycline 100 mg twice daily.  Patient can continue using soap and water to keep the wound clean.  Did state keep it covered when at work or around dirty environments.  Patient's tetanus vaccine was updated this year in 2024.  Signs and symptoms reviewed when to seek urgent or emergent healthcare ordering return to clinic.

## 2023-04-25 NOTE — Patient Instructions (Signed)
Nice to see you today Go ahead and start the antibiotics today. Get both doses in today Follow up if you do not improve or it gets worse

## 2023-04-25 NOTE — Progress Notes (Signed)
   Acute Office Visit  Subjective:     Patient ID: Kevin Chaney, male    DOB: 1975-08-02, 47 y.o.   MRN: [id number]  Chief Complaint  Patient presents with   Hand Pain    Started on Saturday. Pt complains of left pinky finger cut. States finger is swollen. Pain when moving hand. No itchiness or odor. Pt states that his finger got cut by a boars head.       Patient is in today for finger injury  with a history of anxiety. 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 States that there was an arrowhead and it scraped the pinky. He did bleed a little. States that he did clean it with soap and water. State that he was around deer blood the rest of the day. He has not used any over the counter treatments so far.   Review of Systems  Constitutional:  Negative for chills and fever.  Respiratory:  Negative for shortness of breath.   Cardiovascular:  Negative for chest pain.  Skin:        Skin redness "+"  Neurological:  Negative for tingling.        Objective:    BP 120/60   Pulse 81   Temp 98.1 F (36.7 C) (Oral)   Ht 6\' 3"  (1.905 m)   Wt 241 lb 3.2 oz (109.4 kg)   SpO2 97%   BMI 30.15 kg/m    Physical Exam Vitals and nursing note reviewed.  Constitutional:      Appearance: Normal appearance.  Cardiovascular:     Rate and Rhythm: Normal rate and regular rhythm.     Heart sounds: Normal heart sounds.  Pulmonary:     Effort: Pulmonary effort is normal.     Breath sounds: Normal breath sounds.  Musculoskeletal:        General: Signs of injury present.  Skin:    Findings: Bruising present.     Comments: Left 5th digit edematous with erythema and warmth to palpation.     Neurological:     Mental Status: He is alert.     No results found for any visits on 04/25/23.      Assessment & Plan:   Problem List Items Addressed This Visit       Other   Cellulitis of finger of left hand - Primary    Will start patient with  treatment with doxycycline 100 mg twice daily.  Patient can continue using soap and water to keep the wound clean.  Did state keep it covered when at work or around dirty environments.  Patient's tetanus vaccine was updated this year in 2024.  Signs and symptoms reviewed when to seek urgent or emergent healthcare ordering return to clinic.      Relevant Medications   doxycycline (VIBRA-TABS) 100 MG tablet    Meds ordered this encounter  Medications   doxycycline (VIBRA-TABS) 100 MG tablet    Sig: Take 1 tablet (100 mg total) by mouth 2 (two) times daily for 7 days.    Dispense:  14 tablet    Refill:  0    Order Specific Question:   Supervising Provider    Answer:   TOWER, MARNE A [[id number]]    Return if symptoms worsen or fail to improve.  Audria Nine, NP

## 2023-05-01 DIAGNOSIS — N4889 Other specified disorders of penis: Secondary | ICD-10-CM

## 2023-05-11 DIAGNOSIS — L03012 Cellulitis of left finger: Secondary | ICD-10-CM

## 2023-05-11 MED ORDER — SULFAMETHOXAZOLE-TRIMETHOPRIM 800-160 MG PO TABS
1.0000 | ORAL_TABLET | Freq: Two times a day (BID) | ORAL | 0 refills | Status: DC
Start: 2023-05-11 — End: 2023-05-31

## 2023-05-30 ENCOUNTER — Other Ambulatory Visit: Payer: Self-pay

## 2023-05-30 DIAGNOSIS — R310 Gross hematuria: Secondary | ICD-10-CM

## 2023-05-31 ENCOUNTER — Other Ambulatory Visit
Admission: RE | Admit: 2023-05-31 | Discharge: 2023-05-31 | Disposition: A | Discharge status: Home / Self Care | Payer: 59 | Attending: Urology | Admitting: Urology

## 2023-05-31 ENCOUNTER — Encounter: Payer: Self-pay | Admitting: Urology

## 2023-05-31 ENCOUNTER — Ambulatory Visit: Payer: 59 | Admitting: Urology

## 2023-05-31 VITALS — BP 120/74 | HR 91 | Ht 76.0 in | Wt 235.6 lb

## 2023-05-31 DIAGNOSIS — R310 Gross hematuria: Secondary | ICD-10-CM | POA: Diagnosis present

## 2023-05-31 LAB — URINALYSIS, COMPLETE (UACMP) WITH MICROSCOPIC
Bacteria, UA: NONE SEEN
Bilirubin Urine: NEGATIVE
Glucose, UA: NEGATIVE mg/dL
Hgb urine dipstick: NEGATIVE
Ketones, ur: NEGATIVE mg/dL
Leukocytes,Ua: NEGATIVE
Nitrite: NEGATIVE
Protein, ur: NEGATIVE mg/dL
Specific Gravity, Urine: <1.005 — ABNORMAL LOW (ref 1.005–1.030)
pH: 6 (ref 5.0–8.0)

## 2023-05-31 NOTE — Patient Instructions (Signed)
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
## 2023-06-27 ENCOUNTER — Ambulatory Visit
Admission: RE | Admit: 2023-06-27 | Discharge: 2023-06-27 | Disposition: A | Discharge status: Home / Self Care | Payer: 59 | Source: Ambulatory Visit | Attending: Urology | Admitting: Urology

## 2023-06-27 DIAGNOSIS — R310 Gross hematuria: Secondary | ICD-10-CM | POA: Insufficient documentation

## 2023-06-27 MED ORDER — IOHEXOL 300 MG/ML  SOLN
100.0000 mL | Freq: Once | INTRAMUSCULAR | Status: AC | PRN
  Administered 2023-06-27: 100 mL via INTRAVENOUS

## 2023-07-04 ENCOUNTER — Other Ambulatory Visit: Payer: Self-pay

## 2023-07-05 ENCOUNTER — Ambulatory Visit: Payer: 59 | Admitting: Urology

## 2023-07-05 VITALS — BP 134/68 | HR 81

## 2023-07-05 DIAGNOSIS — R310 Gross hematuria: Secondary | ICD-10-CM

## 2023-07-05 MED ORDER — LIDOCAINE HCL URETHRAL/MUCOSAL 2 % EX GEL
1.0000 | Freq: Once | CUTANEOUS | Status: AC
  Administered 2023-07-05: 1 via URETHRAL

## 2023-07-05 NOTE — Progress Notes (Signed)
 Cystoscopy Procedure Note:  Indication: Gross hematuria  After informed consent and discussion of the procedure and its risks, REHAN HOLNESS was positioned and prepped in the standard fashion. Cystoscopy was performed with a flexible cystoscope. The urethra, bladder neck and entire bladder was visualized in a standard fashion. The prostate was moderate in size. The ureteral orifices were visualized in their normal location and orientation.  Bladder mucosa grossly normal throughout, dilated vessels at prostate on retroflexion but no suspicious lesions.  Imaging: CT urogram with no abnormal urologic findings per my review, formal read pending  Findings: Normal cystoscopy  Assessment and Plan: Return precautions discussed, reassurance provided regarding normal cystoscopy, will contact with final report on CT  Redell Burnet, MD 07/05/2023

## 2023-07-27 ENCOUNTER — Ambulatory Visit: Payer: 59 | Admitting: Primary Care

## 2023-07-27 ENCOUNTER — Encounter: Payer: Self-pay | Admitting: Primary Care

## 2023-07-27 VITALS — BP 136/82 | HR 90 | Temp 97.2°F | Ht 76.0 in | Wt 232.0 lb

## 2023-07-27 DIAGNOSIS — F411 Generalized anxiety disorder: Secondary | ICD-10-CM | POA: Insufficient documentation

## 2023-07-27 MED ORDER — CITALOPRAM HYDROBROMIDE 20 MG PO TABS: 20.0000 mg | ORAL_TABLET | Freq: Every day | ORAL | 0 refills | Status: DC

## 2023-07-27 NOTE — Patient Instructions (Signed)
 Start citalopram  20 mg daily for anxiety.  Take 1/2 tablet by mouth once daily for 1 week, then increase to 1 full tablet thereafter.  You will either be contacted via phone regarding your referral to threapy, or you may receive a letter on your MyChart portal from our referral team with instructions for scheduling an appointment. Please let us  know if you have not been contacted by anyone within two weeks.  Please schedule a follow up visit for 6 weeks for follow up of anxiety/depression. This can be virtual or in person.  It was a pleasure to see you today!

## 2023-07-27 NOTE — Progress Notes (Signed)
 Subjective:    Patient ID: Kevin Chaney, male    DOB: 1975-07-08, 48 y.o.   MRN: [id number]  Anxiety Symptoms include nervous/anxious behavior. Patient reports no chest pain, palpitations or shortness of breath.      Kevin Chaney is a very pleasant 48 y.o. male with a history of PTSD, anxiety, and alcohol abuse in remission who presents today to discuss anxiety.  Symptom onset about 2 weeks ago. Symptoms include mind racing thoughts, feeling worried, difficulty relaxing, decreased motivation to do things, trouble sleeping. Prior history of anxiety, has been able to manage well in the past.   He's tried box breathing with temporary improvement. He completed EMDR for PTSD years ago and did well.  He is interested in both therapy and medication for treatment.   He denies SI/HI, palpitations, chest pain.      07/27/2023   12:12 PM  GAD 7 : Generalized Anxiety Score  Nervous, Anxious, on Edge 3  Control/stop worrying 3  Worry too much - different things 3  Trouble relaxing 3  Restless 2  Easily annoyed or irritable 0  Afraid - awful might happen 3  Total GAD 7 Score 17  Anxiety Difficulty Somewhat difficult       07/27/2023   12:12 PM 12/08/2022    7:41 AM  PHQ9 SCORE ONLY  PHQ-9 Total Score 12 0      Review of Systems  Respiratory:  Negative for shortness of breath.   Cardiovascular:  Negative for chest pain and palpitations.  Psychiatric/Behavioral:  The patient is nervous/anxious.        See HPI         Past Medical History:  Diagnosis Date   PTSD (post-traumatic stress disorder) 07/27/2016   PVC's (premature ventricular contractions)     Social History   Socioeconomic History   Marital status: Married    Spouse name: Not on file   Number of children: Not on file   Years of education: Not on file   Highest education level: Not on file  Occupational History   Not on file  Tobacco Use   Smoking status: Former    Types: Cigarettes   Smokeless tobacco:  Current   Tobacco comments:    Dip  Vaping Use   Vaping status: Never Used  Substance and Sexual Activity   Alcohol use: Not Currently   Drug use: No   Sexual activity: Yes    Birth control/protection: None  Other Topics Concern   Not on file  Social History Narrative   Married.   2 children.   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    Social Drivers of Corporate Investment Banker Strain: Not on file  Food Insecurity: Not on file  Transportation Needs: Not on file  Physical Activity: Not on file  Stress: Not on file  Social Connections: Not on file  Intimate Partner Violence: Not on file    Past Surgical History:  Procedure Laterality Date   KNEE ARTHROSCOPY W/ MENISCAL REPAIR Right 2010    Family History  Problem Relation Age of Onset   Hypertension Father    Lung cancer Father    Alcohol abuse Father    Alcohol abuse Brother    Colon cancer Neg Hx    Colon polyps Neg Hx    Esophageal cancer Neg Hx    Rectal cancer Neg Hx    Stomach cancer Neg Hx     Allergies  Allergen Reactions  Codeine     REACTION: GI upset    Current Outpatient Medications on File Prior to Visit  Medication Sig Dispense Refill   famciclovir  (FAMVIR ) 500 MG tablet Take 3 tablets by mouth x 1 dose as needed for outbreaks. 9 tablet 0   sildenafil  (REVATIO ) 20 MG tablet Take 2 to 5 tablets by mouth 30 to 60 minutes prior to sexual activity. 50 tablet 0   No current facility-administered medications on file prior to visit.    BP 136/82   Pulse 90   Temp (!) 97.2 F (36.2 C) (Temporal)   Ht 6' 4 (1.93 m)   Wt 232 lb (105.2 kg)   SpO2 97%   BMI 28.24 kg/m  Objective:   Physical Exam Cardiovascular:     Rate and Rhythm: Normal rate and regular rhythm.  Pulmonary:     Effort: Pulmonary effort is normal.     Breath sounds: Normal breath sounds.  Musculoskeletal:     Cervical back: Neck supple.  Skin:    General: Skin is warm and dry.  Neurological:     Mental Status:  He is alert and oriented to person, place, and time.  Psychiatric:        Mood and Affect: Mood normal.           Assessment & Plan:  GAD (generalized anxiety disorder) Assessment & Plan: GAD7 score, HPI, and history suggests generalized anxiety disorder  Discussed options of therapy and medications.  Shared-decision making for plan of therapy and SSRI  Referral to psychology Start citalopram  20mg  tablets - take one-half tablet daily x1 week then increase to full tablet  Return precautions if side effects of medications occur  Follow up plan for 6 weeks     I evaluated patient, was consulted regarding treatment, and agree with assessment and plan per Andriette Eke, MSN, FNP student.   Kevin Gaskins, NP-C   Orders: -     Citalopram  Hydrobromide; Take 1 tablet (20 mg total) by mouth daily. For anxiety  Dispense: 90 tablet; Refill: 0 -     Ambulatory referral to Psychology        Comer MARLA Gaskins, NP

## 2023-07-27 NOTE — Progress Notes (Signed)
 Acute Office Visit  Subjective:     Patient ID: Kevin Chaney, male    DOB: 1976-05-11, 48 y.o.   MRN: [id number]  Chief Complaint  Patient presents with   Anxiety    Would like to discuss recent development of anxiety    HPI Kevin Chaney is a pleasant 48 year old male with history of PVCs, erectile dysfunction, PTSD, and recovered alcoholism who presents today to discuss anxiety. Symptoms began two weeks ago with no clear trigger at onset. He reports nonspecific work and home stressors. He feels anxious, overly worried as if something will happen. He reports that his mind races and has trouble turning this off. He reports decreased motivation. He has tried box breathing with temporary relief. For history of PTSD he underwent EMDR therapy. Denies sleep pattern changes but reports at baseline does not sleep well. No reported SI/HI. Denies chest pain, palpitations, and shortness of breath.    Review of Systems  Constitutional: Negative.   Respiratory: Negative.    Cardiovascular: Negative.   Psychiatric/Behavioral:  Positive for depression. Negative for suicidal ideas. The patient is nervous/anxious.         Objective:    BP 136/82   Pulse 90   Temp (!) 97.2 F (36.2 C) (Temporal)   Ht 6' 4 (1.93 m)   Wt 105.2 kg   SpO2 97%   BMI 28.24 kg/m        07/27/2023   12:12 PM  GAD 7 : Generalized Anxiety Score  Nervous, Anxious, on Edge 3  Control/stop worrying 3  Worry too much - different things 3  Trouble relaxing 3  Restless 2  Easily annoyed or irritable 0  Afraid - awful might happen 3  Total GAD 7 Score 17  Anxiety Difficulty Somewhat difficult    Flowsheet Row Office Visit from 07/27/2023 in [facility] [place]  PHQ-9 Total Score 12        Physical Exam Constitutional:      Appearance: Normal appearance.  Cardiovascular:     Rate and Rhythm: Normal rate and regular rhythm.  Pulmonary:     Effort: Pulmonary effort is normal.      Breath sounds: Normal breath sounds.  Neurological:     General: No focal deficit present.     Mental Status: He is alert and oriented to person, place, and time.  Psychiatric:        Behavior: Behavior normal.        Judgment: Judgment normal.     No results found for any visits on 07/27/23.      Assessment & Plan:   Problem List Items Addressed This Visit       Other   GAD (generalized anxiety disorder) - Primary   GAD7 score, HPI, and history suggests generalized anxiety disorder  Discussed options of therapy and medications.  Shared-decision making for plan of therapy and SSRI  Referral to psychology Start citalopram  20mg  tablets - take one-half tablet daily x1 week then increase to full tablet  Return precautions if side effects of medications occur  Follow up plan for 6 weeks         Relevant Medications   citalopram  (CELEXA ) 20 MG tablet   Other Relevant Orders   Ambulatory referral to Psychology    Meds ordered this encounter  Medications   citalopram  (CELEXA ) 20 MG tablet    Sig: Take 1 tablet (20 mg total) by mouth daily. For anxiety    Dispense:  90 tablet    Refill:  0    Disposition: return in about 6 weeks for follow up  Glynn Yepes M Mykayla Brinton, RN

## 2023-07-27 NOTE — Assessment & Plan Note (Addendum)
 GAD7 score, HPI, and history suggests generalized anxiety disorder  Discussed options of therapy and medications.  Shared-decision making for plan of therapy and SSRI  Referral to psychology Start citalopram  20mg  tablets - take one-half tablet daily x1 week then increase to full tablet  Return precautions if side effects of medications occur  Follow up plan for 6 weeks     I evaluated patient, was consulted regarding treatment, and agree with assessment and plan per Andriette Eke, MSN, FNP student.   Mallie Gaskins, NP-C

## 2023-08-27 ENCOUNTER — Other Ambulatory Visit: Payer: Self-pay | Admitting: Primary Care

## 2023-08-27 DIAGNOSIS — F411 Generalized anxiety disorder: Secondary | ICD-10-CM

## 2023-09-20 ENCOUNTER — Other Ambulatory Visit: Payer: Self-pay | Admitting: Primary Care

## 2023-09-20 DIAGNOSIS — B001 Herpesviral vesicular dermatitis: Secondary | ICD-10-CM

## 2023-09-20 MED ORDER — FAMCICLOVIR 500 MG PO TABS: ORAL_TABLET | ORAL | 0 refills | Status: DC

## 2023-10-23 ENCOUNTER — Other Ambulatory Visit: Payer: Self-pay | Admitting: Primary Care

## 2023-10-23 DIAGNOSIS — F411 Generalized anxiety disorder: Secondary | ICD-10-CM

## 2023-10-23 NOTE — Telephone Encounter (Signed)
 Please call patient:  We initiated citalopram  a few months ago for anxiety.  How is he doing on citalopram  medication for anxiety?

## 2023-10-24 NOTE — Telephone Encounter (Signed)
 Copied from CRM [id number]. Topic: Clinical - Medication Question >> Oct 24, 2023 11:27 AM Juleen Oakland F wrote: Reason for CRM: Patient returned [facility] phone call, said he's doing very well on the citalopram  medication and he'll be due for a refill in about 2 weeks.

## 2023-10-24 NOTE — Telephone Encounter (Signed)
 Unable to reach patient. Left voicemail to return call to our office.

## 2023-10-24 NOTE — Telephone Encounter (Signed)
 Glad to hear this.  Refill sent to pharmacy.

## 2024-02-08 ENCOUNTER — Other Ambulatory Visit: Payer: Self-pay | Admitting: Primary Care

## 2024-02-08 DIAGNOSIS — F411 Generalized anxiety disorder: Secondary | ICD-10-CM

## 2024-03-10 ENCOUNTER — Other Ambulatory Visit: Admitting: Radiology

## 2024-03-10 ENCOUNTER — Ambulatory Visit (INDEPENDENT_AMBULATORY_CARE_PROVIDER_SITE_OTHER): Admitting: Radiology

## 2024-03-10 ENCOUNTER — Ambulatory Visit
Admission: EM | Admit: 2024-03-10 | Discharge: 2024-03-10 | Disposition: A | Discharge status: Home / Self Care | Attending: Physician Assistant | Admitting: Physician Assistant

## 2024-03-10 ENCOUNTER — Other Ambulatory Visit: Payer: Self-pay

## 2024-03-10 DIAGNOSIS — R0781 Pleurodynia: Secondary | ICD-10-CM

## 2024-03-10 DIAGNOSIS — S2232XA Fracture of one rib, left side, initial encounter for closed fracture: Secondary | ICD-10-CM | POA: Diagnosis not present
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
## 2024-05-31 DIAGNOSIS — N529 Male erectile dysfunction, unspecified: Secondary | ICD-10-CM

## 2024-05-31 DIAGNOSIS — B001 Herpesviral vesicular dermatitis: Secondary | ICD-10-CM

## 2024-05-31 MED ORDER — FAMCICLOVIR 500 MG PO TABS: ORAL_TABLET | ORAL | 0 refills | Status: AC

## 2024-05-31 MED ORDER — SILDENAFIL CITRATE 20 MG PO TABS: ORAL_TABLET | ORAL | 0 refills | Status: AC

## 2024-06-08 ENCOUNTER — Other Ambulatory Visit: Payer: Self-pay | Admitting: Primary Care

## 2024-06-08 DIAGNOSIS — F411 Generalized anxiety disorder: Secondary | ICD-10-CM

## 2024-07-25 ENCOUNTER — Other Ambulatory Visit (HOSPITAL_BASED_OUTPATIENT_CLINIC_OR_DEPARTMENT_OTHER): Payer: Self-pay | Admitting: Family Medicine

## 2024-07-25 DIAGNOSIS — Z8249 Family history of ischemic heart disease and other diseases of the circulatory system: Secondary | ICD-10-CM
